# Patient Record
Sex: Male | Born: 2000 | Race: White | Hispanic: No | Marital: Single | Smoking: Never smoker
Health system: Southern US, Community
[De-identification: ages and names within clinical notes are randomized; demographics above are authoritative.]

## PROBLEM LIST (undated history)

## (undated) DIAGNOSIS — R569 Unspecified convulsions: Secondary | ICD-10-CM

## (undated) HISTORY — PX: KNEE SURGERY: SHX244

---
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
## 2021-01-28 ENCOUNTER — Encounter: Payer: Self-pay | Admitting: Internal Medicine
